# Patient Record
Sex: Male | Born: 1970 | Race: White | Hispanic: No | Marital: Married | Smoking: Former smoker
Health system: Southern US, Community
[De-identification: ages and names within clinical notes are randomized; demographics above are authoritative.]

## PROBLEM LIST (undated history)

## (undated) DIAGNOSIS — I10 Essential (primary) hypertension: Secondary | ICD-10-CM

## (undated) DIAGNOSIS — M545 Low back pain, unspecified: Secondary | ICD-10-CM

---

## 2019-09-30 ENCOUNTER — Other Ambulatory Visit: Payer: Self-pay | Admitting: Physician Assistant

## 2019-09-30 DIAGNOSIS — E291 Testicular hypofunction: Secondary | ICD-10-CM

## 2020-10-21 ENCOUNTER — Emergency Department (HOSPITAL_COMMUNITY): Payer: Medicare Other

## 2020-10-21 ENCOUNTER — Emergency Department (HOSPITAL_COMMUNITY)
Admission: EM | Admit: 2020-10-21 | Discharge: 2020-10-21 | Discharge status: Against Medical Advice | Payer: Medicare Other | Attending: Emergency Medicine | Admitting: Emergency Medicine

## 2020-10-21 ENCOUNTER — Encounter (HOSPITAL_COMMUNITY): Payer: Self-pay

## 2020-10-21 DIAGNOSIS — S0990XA Unspecified injury of head, initial encounter: Secondary | ICD-10-CM | POA: Diagnosis present

## 2020-10-21 DIAGNOSIS — Y900 Blood alcohol level of less than 20 mg/100 ml: Secondary | ICD-10-CM | POA: Insufficient documentation

## 2020-10-21 DIAGNOSIS — Z87891 Personal history of nicotine dependence: Secondary | ICD-10-CM | POA: Diagnosis not present

## 2020-10-21 DIAGNOSIS — Z79899 Other long term (current) drug therapy: Secondary | ICD-10-CM | POA: Insufficient documentation

## 2020-10-21 DIAGNOSIS — S0081XA Abrasion of other part of head, initial encounter: Secondary | ICD-10-CM | POA: Insufficient documentation

## 2020-10-21 DIAGNOSIS — R531 Weakness: Secondary | ICD-10-CM | POA: Diagnosis not present

## 2020-10-21 DIAGNOSIS — W01198A Fall on same level from slipping, tripping and stumbling with subsequent striking against other object, initial encounter: Secondary | ICD-10-CM | POA: Insufficient documentation

## 2020-10-21 DIAGNOSIS — R001 Bradycardia, unspecified: Secondary | ICD-10-CM

## 2020-10-21 DIAGNOSIS — I959 Hypotension, unspecified: Secondary | ICD-10-CM

## 2020-10-21 DIAGNOSIS — Y92009 Unspecified place in unspecified non-institutional (private) residence as the place of occurrence of the external cause: Secondary | ICD-10-CM | POA: Insufficient documentation

## 2020-10-21 DIAGNOSIS — I10 Essential (primary) hypertension: Secondary | ICD-10-CM | POA: Diagnosis not present

## 2020-10-21 HISTORY — DX: Low back pain, unspecified: M54.50

## 2020-10-21 HISTORY — DX: Essential (primary) hypertension: I10

## 2020-10-21 LAB — BASIC METABOLIC PANEL
Anion gap: 7 (ref 5–15)
BUN: 16 mg/dL (ref 6–20)
CO2: 22 mmol/L (ref 22–32)
Calcium: 9.7 mg/dL (ref 8.9–10.3)
Chloride: 109 mmol/L (ref 98–111)
Creatinine, Ser: 1.69 mg/dL — ABNORMAL HIGH (ref 0.61–1.24)
GFR, Estimated: 49 mL/min — ABNORMAL LOW (ref >60)
Glucose, Bld: 119 mg/dL — ABNORMAL HIGH (ref 70–99)
Potassium: 4.1 mmol/L (ref 3.5–5.1)
Sodium: 138 mmol/L (ref 135–145)

## 2020-10-21 LAB — CBC
HCT: 39.7 % (ref 39.0–52.0)
Hemoglobin: 13 g/dL (ref 13.0–17.0)
MCH: 30.1 pg (ref 26.0–34.0)
MCHC: 32.7 g/dL (ref 30.0–36.0)
MCV: 91.9 fL (ref 80.0–100.0)
Platelets: 231 10*3/uL (ref 150–400)
RBC: 4.32 MIL/uL (ref 4.22–5.81)
RDW: 13.9 % (ref 11.5–15.5)
WBC: 7.2 10*3/uL (ref 4.0–10.5)
nRBC: 0 % (ref 0.0–0.2)

## 2020-10-21 LAB — ETHANOL: Alcohol, Ethyl (B): <10 mg/dL (ref <10)

## 2020-10-21 LAB — TROPONIN I (HIGH SENSITIVITY)
Troponin I (High Sensitivity): 3 ng/L (ref <18)
Troponin I (High Sensitivity): 4 ng/L (ref <18)

## 2020-10-21 LAB — LACTIC ACID, PLASMA: Lactic Acid, Venous: 1.7 mmol/L (ref 0.5–1.9)

## 2020-10-21 MED ORDER — SODIUM CHLORIDE 0.9 % IV BOLUS
1000.0000 mL | Freq: Once | INTRAVENOUS | Status: AC
  Administered 2020-10-21: 1000 mL via INTRAVENOUS

## 2020-10-21 NOTE — ED Notes (Signed)
Notified Dr Anitra Lauth of pt's bp and obtained verbal orders for a second bolus of fluids.

## 2020-10-21 NOTE — ED Notes (Signed)
Patient transported to CT 

## 2020-10-21 NOTE — ED Provider Notes (Signed)
Emergency Medicine Provider Triage Evaluation Note  Jon Long , a 50 y.o. male  was evaluated in triage.  Pt complains of his legs giving out.  He states that his legs have intermittently been giving out on him for "years."  He denies any fevers.  He states he has not been sick stating "I never get sick."  He denies accidentally taking too much of his medications.  Today he was getting out of the car when he fell.  He struck his left-sided head and scraped his left knee.  He denies any other injuries.  Review of Systems  Positive: Weakness, legs giving out Negative: Fevers, N/V/D.  Physical Exam  BP (!) 80/54 (BP Location: Right Arm)   Pulse 97   Temp 98.1 F (36.7 C) (Oral)   Resp 16   SpO2 95%  Gen:   Awake, no distress   Resp:  Normal effort  MSK:   Moves extremities without difficulty  Other:  Abrasion on left sided head, left knee.   Medical Decision Making  Medically screening exam initiated at 2:04 PM.  Appropriate orders placed.  Ade Stmarie was informed that the remainder of the evaluation will be completed by another provider, this initial triage assessment does not replace that evaluation, and the importance of remaining in the ED until their evaluation is complete.  Patient is hypotensive, will need to go directly to room from traige.     Cristina Gong, Cordelia Poche 10/21/20 2131    Rolan Bucco, MD 10/24/20 1504

## 2020-10-21 NOTE — ED Triage Notes (Signed)
Pt  here from home with c/o gen weakness, states that his legs give out , hypotensive in triage , pt fell and hit his head today , no blood thinners

## 2020-10-21 NOTE — ED Notes (Signed)
Pt has superficial abrasions on left side of anterior of head and a small superficial abraision of left knee. Bleeding is controlled of both.

## 2020-10-21 NOTE — ED Notes (Signed)
Cali Cuartas wife [phone]

## 2020-10-21 NOTE — ED Notes (Addendum)
Dr Wilkie Aye at bedside. Informed MD re: HR= 39 to 40s sustaining, BP now 108/64. Pt at bedside denies complaints. States "I feel great".

## 2020-10-21 NOTE — ED Notes (Signed)
Notified Dr Wilkie Aye of pt's bp despite giving 2L of NS and she said she will come see pt.

## 2020-10-21 NOTE — ED Provider Notes (Signed)
[facility] EMERGENCY DEPARTMENT Provider Note   CSN: [id number] Arrival date & time: 10/21/20  1346     History No chief complaint on file.   Shi Blankenship is a 50 y.o. male.  HPI  50 year old male with past medical history of HTN presents the emergency department with weakness and a fall with head injury.  Patient states over the past couple years he has episodes where he is weak and his "legs give out".  This happened when he got out of the car today, he states his legs gave out and he fell forward hitting the left side of his head.  No loss of consciousness.  No syncope.  Patient states has been compliant with his medications and otherwise in his baseline health.  He admits that he has had trouble with his blood pressure being too low in the past but he has not been following up and there is no active work-up for this.  He denies any chest pain, shortness of breath, lightheadedness.  Patient states that he feels back to baseline and denies any current complaints.  Currently there is no family at bedside.  He does not know what medications he is on.  He is a very minimal historian.  Past Medical History:  Diagnosis Date   Hypertension    Low back pain     There are no problems to display for this patient.   History reviewed. No pertinent surgical history.     No family history on file.  Social History   Tobacco Use   Smoking status: Former    Years: 30.00    Pack years: 0.00    Types: Cigarettes   Smokeless tobacco: Never  Substance Use Topics   Alcohol use: Never   Drug use: Never    Home Medications Prior to Admission medications   Medication Sig Start Date End Date Taking? Authorizing Provider  acetaminophen (TYLENOL) 500 MG tablet Take 2,000 mg by mouth 2 (two) times daily as needed for headache.   Yes [provider]  Calcium Carb-Cholecalciferol (CALCIUM 600 + D PO) Take 1 tablet by mouth at bedtime.   Yes [provider]   Cholecalciferol (VITAMIN D3) 250 MCG (10000 UT) capsule Take 20,000 Units by mouth every morning.   Yes [provider]  clonazePAM (KLONOPIN) 0.5 MG tablet Take 0.5 mg by mouth 2 (two) times daily. scheduled 10/14/20  Yes [provider]  famotidine (PEPCID) 40 MG tablet Take 40 mg by mouth daily with supper. 07/17/20  Yes [provider]  HYDROmorphone (DILAUDID) 4 MG tablet Take 4 mg by mouth 4 (four) times daily. scheduled 10/14/20  Yes [provider]  lisinopril (ZESTRIL) 10 MG tablet Take 10 mg by mouth in the morning. 06/22/20  Yes [provider]  methocarbamol (ROBAXIN) 750 MG tablet Take 1,500 mg by mouth 3 (three) times daily. scheduled 10/07/20  Yes [provider]  omeprazole (PRILOSEC) 20 MG capsule Take 20 mg by mouth in the morning. 07/17/20  Yes [provider]  ondansetron (ZOFRAN) 8 MG tablet Take 8 mg by mouth 2 (two) times daily as needed for nausea or vomiting.   Yes [provider]  pregabalin (LYRICA) 50 MG capsule Take 50 mg by mouth 2 (two) times daily. 10/14/20  Yes [provider]  risperiDONE (RISPERDAL) 1 MG tablet Take 1 mg by mouth in the morning. 07/17/20  Yes [provider]  topiramate (TOPAMAX) 100 MG tablet Take 100 mg by  mouth 2 (two) times daily. 10/14/20  Yes [provider]    Allergies    Venlafaxine  Review of Systems   Review of Systems  Constitutional:  Positive for fatigue. Negative for chills and fever.  HENT:  Negative for congestion.   Eyes:  Negative for visual disturbance.  Respiratory:  Negative for chest tightness and shortness of breath.   Cardiovascular:  Negative for chest pain, palpitations and leg swelling.  Gastrointestinal:  Negative for abdominal pain, diarrhea and vomiting.  Genitourinary:  Negative for dysuria.  Musculoskeletal:  Negative for neck pain.  Skin:  Positive for wound. Negative for rash.  Neurological:  Positive for weakness.  Negative for seizures, syncope and headaches.   Physical Exam Updated Vital Signs BP (!) 92/59   Pulse (!) 47   Temp 98.1 F (36.7 C) (Oral)   Resp 19   Ht 5\' 10"  (1.778 m)   Wt 80.3 kg   SpO2 99%   BMI 25.40 kg/m   Physical Exam Vitals and nursing note reviewed.  Constitutional:      Appearance: Normal appearance.     Comments: Flat affect, minimal historian  HENT:     Head: Normocephalic.     Comments: Abrasions to forehead    Mouth/Throat:     Mouth: Mucous membranes are moist.  Eyes:     Pupils: Pupils are equal, round, and reactive to light.  Cardiovascular:     Rate and Rhythm: Normal rate.  Pulmonary:     Effort: Pulmonary effort is normal. No respiratory distress.  Abdominal:     Palpations: Abdomen is soft.     Tenderness: There is no abdominal tenderness.  Musculoskeletal:        General: No deformity.     Cervical back: No tenderness.  Skin:    General: Skin is warm.  Neurological:     Mental Status: He is alert and oriented to person, place, and time. Mental status is at baseline.  Psychiatric:        Mood and Affect: Mood normal.    ED Results / Procedures / Treatments   Labs (all labs ordered are listed, but only abnormal results are displayed) Labs Reviewed  BASIC METABOLIC PANEL - Abnormal; Notable for the following components:      Result Value   Glucose, Bld 119 (*)    Creatinine, Ser 1.69 (*)    GFR, Estimated 49 (*)    All other components within normal limits  CBC  ETHANOL  LACTIC ACID, PLASMA  URINALYSIS, ROUTINE W REFLEX MICROSCOPIC  RAPID URINE DRUG SCREEN, HOSP PERFORMED  TROPONIN I (HIGH SENSITIVITY)  TROPONIN I (HIGH SENSITIVITY)    EKG EKG Interpretation  Date/Time:  Friday October 21 2020 13:53:55 EDT Ventricular Rate:  102 PR Interval:  164 QRS Duration: 80 QT Interval:  324 QTC Calculation: 422 R Axis:   36 Text Interpretation: Sinus tachycardia Otherwise normal ECG Confirmed by 01-22-2000 [phone]) on 10/21/2020  3:17:08 PM  Radiology DG Chest Port 1 View  Result Date: 10/21/2020 CLINICAL DATA:  Syncopal episodes. EXAM: PORTABLE CHEST 1 VIEW COMPARISON:  None. FINDINGS: The heart size and mediastinal contours are within normal limits. Both lungs are clear. The visualized skeletal structures are unremarkable. IMPRESSION: No active disease. Electronically Signed   By: 10/23/2020 M.D.   On: 10/21/2020 15:51    Procedures Procedures   Medications Ordered in ED Medications  sodium chloride 0.9 % bolus 1,000 mL (0 mLs Intravenous Stopped 10/21/20 1542)  sodium chloride 0.9 % bolus 1,000 mL (0 mLs Intravenous Stopped 10/21/20 1542)    ED Course  I have reviewed the triage vital signs and the nursing notes.  Pertinent labs & imaging results that were available during my care of the patient were reviewed by me and considered in my medical decision making (see chart for details).    MDM Rules/Calculators/A&P                          50 year old male who presents emergency department after a episode of weakness, his legs giving out with head injury.  He is a minimal historian, requesting to leave the hospital every time I speak with him.  On arrival he is hypotensive with systolics in the 70s.  After 2 L of normal saline he continues to be hypotensive, normal heart rate, no family at bedside.  He is adamantly denying any symptoms including chest pain, syncope.  He states that he feels back to baseline and wants to leave.  I was able to convince him to stay for his work-up and until his wife gets here.  Blood work is reassuring, creatinine is slightly elevated at 1.69 but no other acute abnormalities.  No signs of sepsis, alcohol is negative.  CT imaging was negative, chest x-ray is clear.  Troponin is negative.  During his evaluation the patient became bradycardic with heart rates dropping into the low 40s, at this time his blood pressure started to respond to his third liter of normal saline and  became normotensive.  Patient's heart rate was very labile ranging anywhere from low 40s to 60s.  He continued to state that he is asymptomatic.  The wife arrived and she states that he has been having these weakness episodes very frequently.  Sometimes he becomes "unresponsive" and does have syncope.  Other times he just kind of stares off in space, no admitted seizure-like activity.  My concern would be for this bradycardia and hypotension causing the symptoms, syncope and possible hypoperfusion.  I have requested for further work-up and to admit the patient however he denies.  He states that hospitals make him nervous and he refuses to stay.  Patient understands my concern in regards to his ranging heart rate and low blood pressure.  He understands this could mean a primary heart problem, arrhythmia, could result in permanent disability and/or death.  The wife is at bedside during this conversation.  Patient has capacity and is alert and oriented.  I was unable to convince the patient to stay for further evaluation.  Patient and wife left before I was able to give them discharge paperwork.  I will place an ambulatory referral to cardiology in hopes of outpatient evaluation of this bradycardia and hypotension that is most likely contributing to these weakness episodes.    I have recommended that the patient stays in the department for the completion of their workup however they decline.  I have expressed the importance of staying including the risks of leaving which include worsening condition, permanent disability and death.  Patient accepts these risks.  They are alert and oriented, they have capacity to make this decision.  I have not been able to convince the patient to stay and they understand the risks of leaving AGAINST MEDICAL ADVICE.    Final Clinical Impression(s) / ED Diagnoses Final diagnoses:  None    Rx / DC Orders ED Discharge Orders     None  Rozelle Logan,  DO 10/21/20 1849

## 2020-10-21 NOTE — ED Notes (Signed)
Notified Dr Wilkie Aye that pt is now bradycardic in the 40's, she said she will come see pt.

## 2020-10-25 NOTE — Progress Notes (Signed)
TOC CSW accepted a call from pts wife @ 11:03am in regards to pts Cardiologist appointment.  CSW informed pt to seek a referral from his PCP, due to him leaving AMA.  CSW also informed pts wife that pt would have received that referral from his AVS at the time of discharge but by pt leaving AMA he did not receive his AVS.  CSW did state the following that was on AVS, but instructed pts wife to obtain a referral from PCP.  Ambulatory referral to Cardiology  Where:[facility] Address:[address] [place] [address] Phone:[phone] Expires:10/21/2021 (requested) Bradycardia, hypotension, episodes of "weakness", left AMA   Naara Kelty Tarpley-Carter, MSW, LCSW-A Pronouns:  She, Her, Hers                  [facility] ED Transitions of CareClinical Social Worker [email or web address] [phone]

## 2021-01-11 ENCOUNTER — Ambulatory Visit: Payer: Medicare Other | Admitting: Podiatry

## 2021-02-07 ENCOUNTER — Ambulatory Visit: Payer: Medicare Other | Admitting: Podiatry

## 2022-04-17 IMAGING — CT CT CERVICAL SPINE W/O CM
3 of 4 series · 13 of 33 positions shown, 16 images · non-contrast
Comparison: No pertinent prior exams available for comparison.

CLINICAL DATA: Head trauma, moderate/severe. Additional provided:
Fall, laceration to forehead, high blood pressure.

EXAM:
CT HEAD WITHOUT CONTRAST
CT CERVICAL SPINE WITHOUT CONTRAST
TECHNIQUE: Multidetector CT imaging of the head and cervical spine was
performed following the standard protocol without intravenous
contrast. Multiplanar CT image reconstructions of the cervical spine
were also generated.

[Series 8: sag bone · sagittal · 0.33mm/px · 5 of 92 slices shown, 6 images]
[im 31/92  bone]
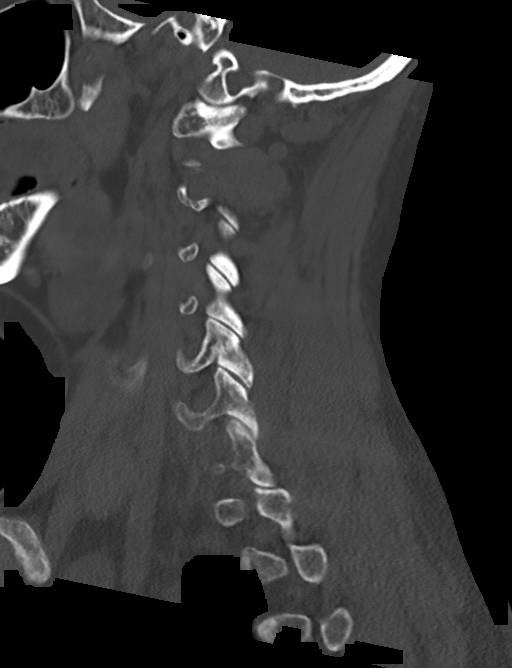
[im 38/92  bone]
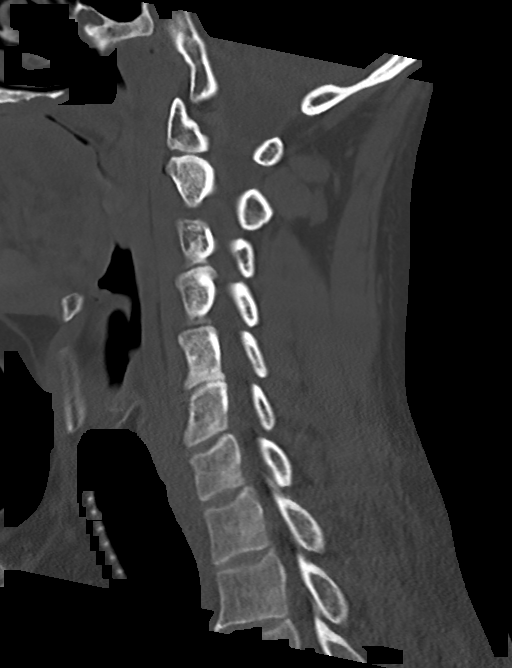
[im 46/92  soft-tissue]
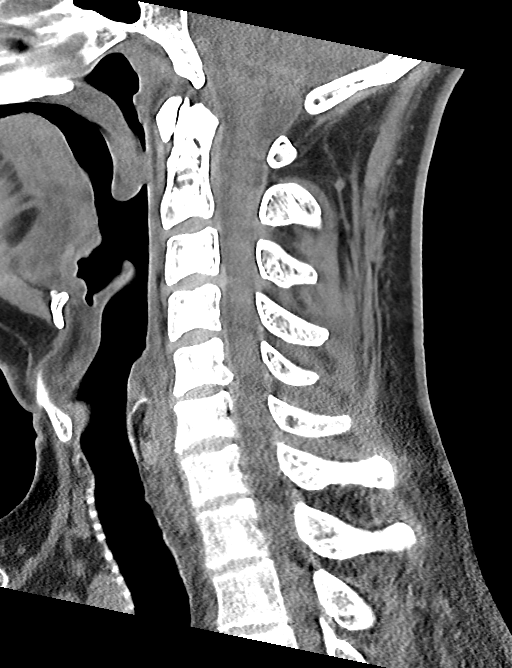
[im 46/92  bone]
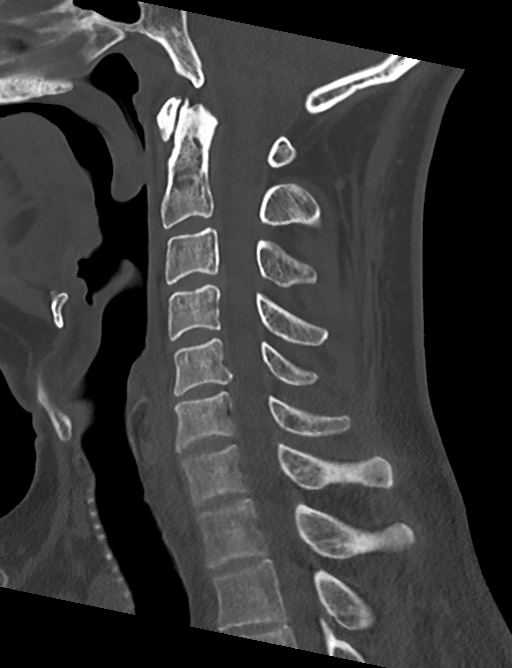
[im 54/92  bone]
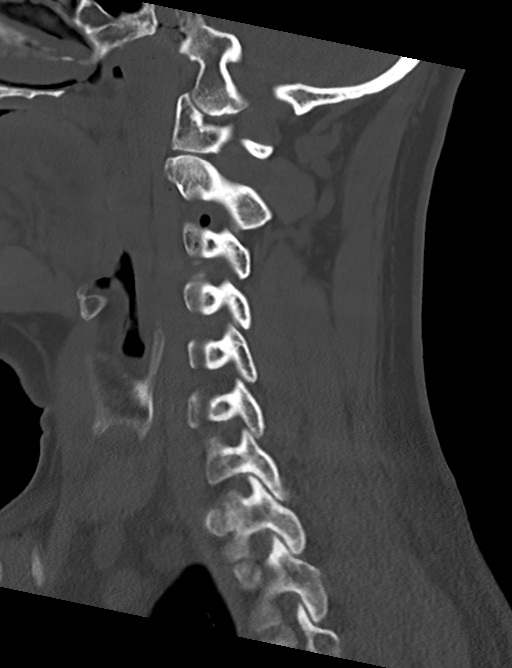
[im 61/92  bone]
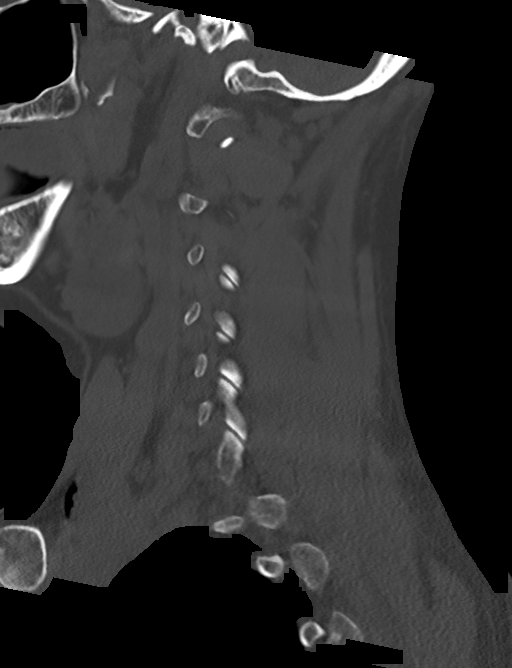

[Series 9: cor bone · coronal · 0.40mm/px · 3 of 70 slices shown]
[im 14/70  bone]
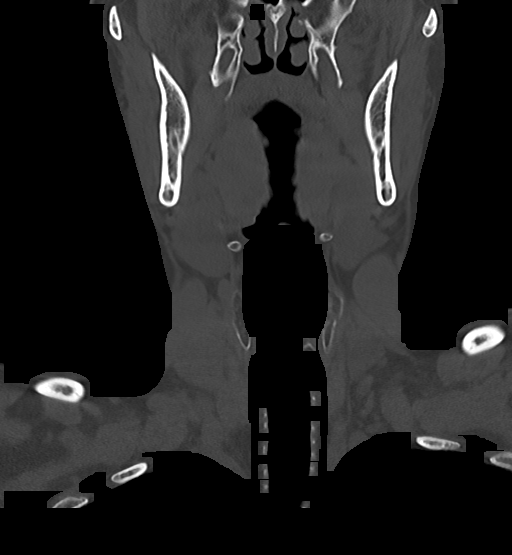
[im 28/70  bone]
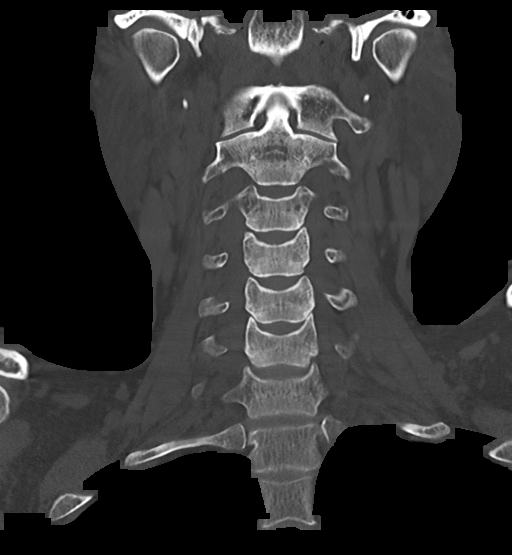
[im 42/70  bone]
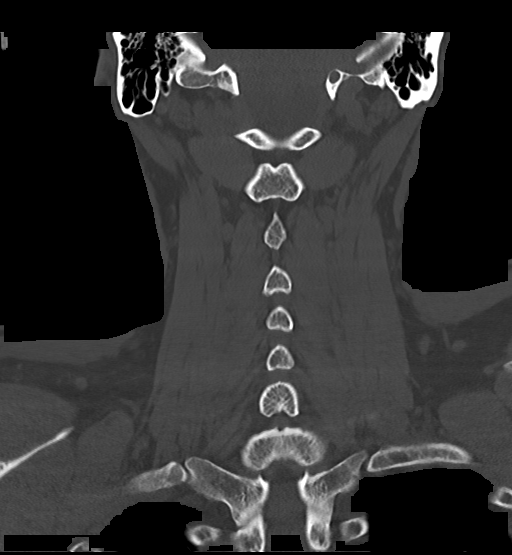

[Series 10: orthogonal axials · axial · 0.21mm/px · z∈[-263,-142]mm · 5 of 97 slices shown, 7 images]
[im 17/97  soft-tissue]
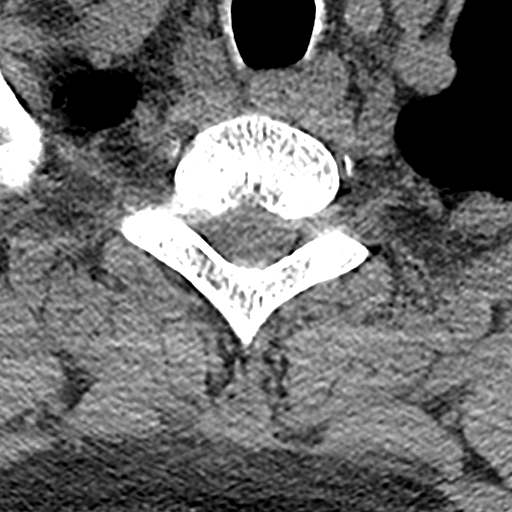
[im 17/97  bone]
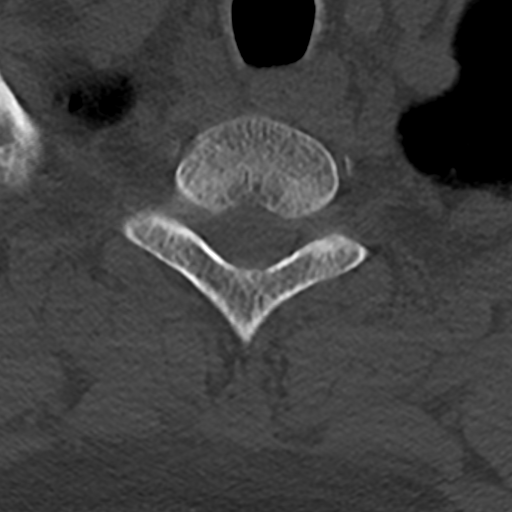
[im 33/97  bone]
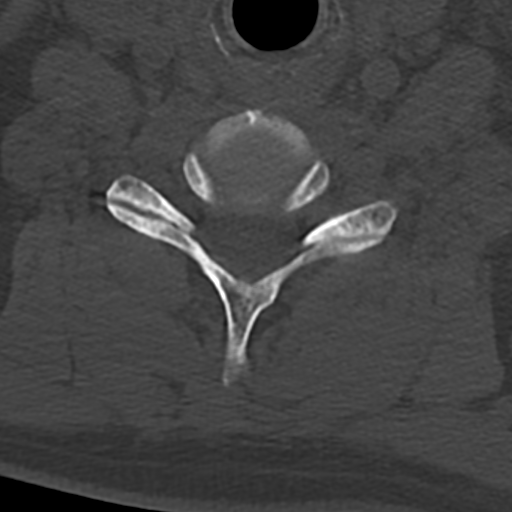
[im 49/97  bone]
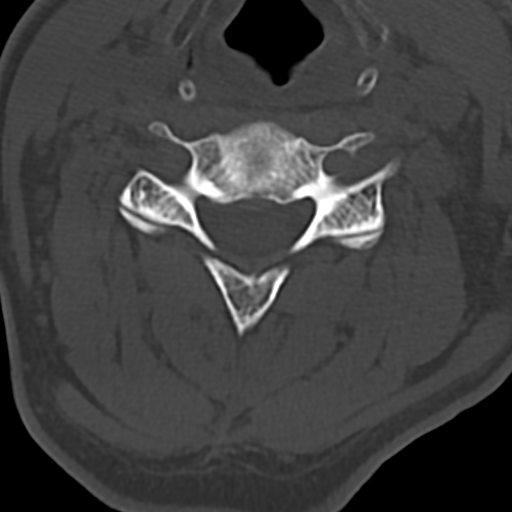
[im 65/97  bone]
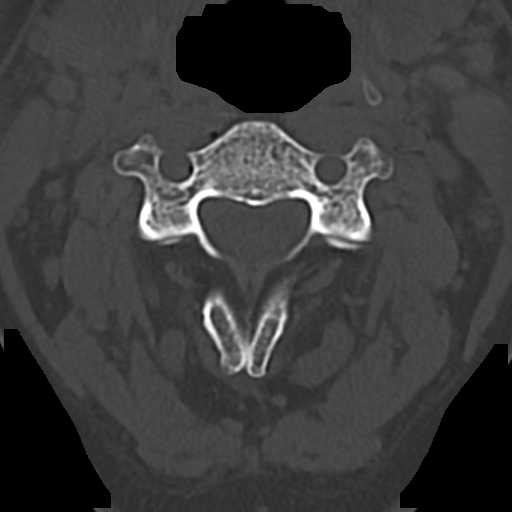
[im 81/97  soft-tissue]
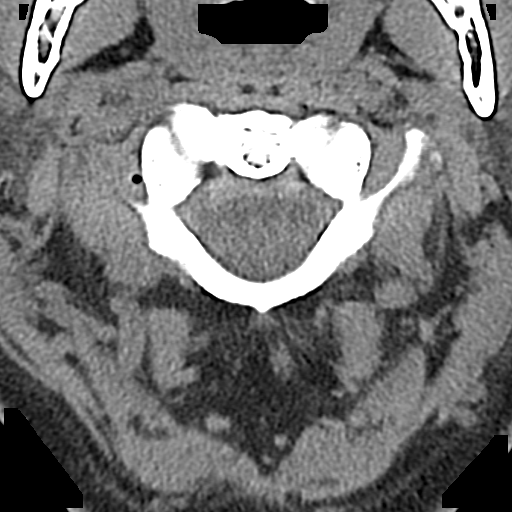
[im 81/97  bone]
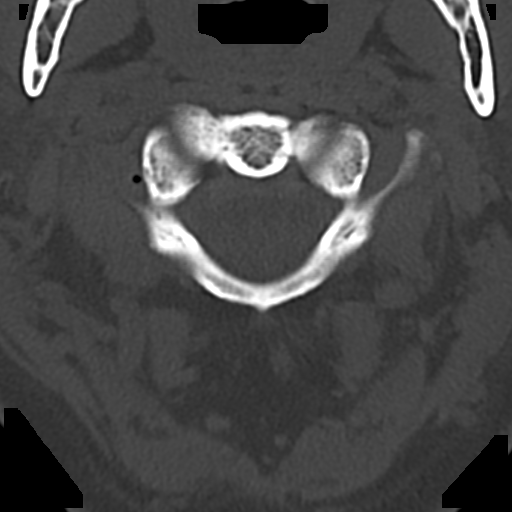

[13 of 33 positions shown; findings below may reference images not displayed]

FINDINGS: CT HEAD FINDINGS

Brain:

Cerebral volume is normal for age.

There is no acute intracranial hemorrhage.

No demarcated cortical infarct.

No extra-axial fluid collection.

No evidence of intracranial mass.

No midline shift.

Vascular: No hyperdense vessel.  Atherosclerotic calcifications.

Skull: Normal. Negative for fracture or focal lesion.

Sinuses/Orbits: Visualized orbits show no acute finding. Mild
bilateral ethmoid sinus mucosal thickening.

Other: Scattered venous gas, likely from recent IV placement.

CT CERVICAL SPINE FINDINGS

Alignment: Straightening of the expected cervical lordosis. No
significant spondylolisthesis

Skull base and vertebrae: The basion-dental and atlanto-dental
intervals are maintained.No evidence of acute fracture to the
cervical spine.

Soft tissues and spinal canal: No prevertebral fluid or swelling. No
visible canal hematoma.

Disc levels: Cervical spondylosis. Most notably at C5-C6, there is
mild-to-moderate disc space narrowing with a central disc
protrusion, endplate spurring and uncovertebral hypertrophy.
Apparent mild/moderate spinal canal stenosis at this level.
Uncovertebral hypertrophy results in bilateral bony neural foraminal
narrowing at C3-C4.

Upper chest: No consolidation within the imaged lung apices. No
visible pneumothorax.
IMPRESSION: CT head:

1. No evidence of acute intracranial abnormality.
2. Mild bilateral ethmoid sinus mucosal thickening.

CT cervical spine:

1. No evidence of acute fracture to the cervical spine.
2. Nonspecific straightening of the expected cervical lordosis.
3. Cervical spondylosis, as described.

## 2022-07-14 DIAGNOSIS — M542 Cervicalgia: Secondary | ICD-10-CM | POA: Diagnosis not present

## 2022-07-14 DIAGNOSIS — Z5900 Homelessness unspecified: Secondary | ICD-10-CM | POA: Diagnosis not present

## 2022-07-14 DIAGNOSIS — R001 Bradycardia, unspecified: Secondary | ICD-10-CM | POA: Diagnosis not present

## 2022-07-14 DIAGNOSIS — W1839XA Other fall on same level, initial encounter: Secondary | ICD-10-CM | POA: Diagnosis not present

## 2022-07-14 DIAGNOSIS — R7989 Other specified abnormal findings of blood chemistry: Secondary | ICD-10-CM | POA: Diagnosis not present

## 2022-07-14 DIAGNOSIS — R55 Syncope and collapse: Secondary | ICD-10-CM | POA: Diagnosis not present

## 2022-07-14 DIAGNOSIS — M25571 Pain in right ankle and joints of right foot: Secondary | ICD-10-CM | POA: Diagnosis not present

## 2022-07-14 DIAGNOSIS — Z87891 Personal history of nicotine dependence: Secondary | ICD-10-CM | POA: Diagnosis not present

## 2022-07-14 DIAGNOSIS — S199XXA Unspecified injury of neck, initial encounter: Secondary | ICD-10-CM | POA: Diagnosis not present

## 2022-07-14 DIAGNOSIS — Z79899 Other long term (current) drug therapy: Secondary | ICD-10-CM | POA: Diagnosis not present

## 2022-07-14 DIAGNOSIS — Y9301 Activity, walking, marching and hiking: Secondary | ICD-10-CM | POA: Diagnosis not present

## 2022-07-14 DIAGNOSIS — I10 Essential (primary) hypertension: Secondary | ICD-10-CM | POA: Diagnosis not present

## 2022-07-14 DIAGNOSIS — X58XXXA Exposure to other specified factors, initial encounter: Secondary | ICD-10-CM | POA: Diagnosis not present

## 2022-07-14 DIAGNOSIS — S0990XA Unspecified injury of head, initial encounter: Secondary | ICD-10-CM | POA: Diagnosis not present

## 2022-07-14 DIAGNOSIS — S99911A Unspecified injury of right ankle, initial encounter: Secondary | ICD-10-CM | POA: Diagnosis not present

## 2022-07-15 ENCOUNTER — Other Ambulatory Visit: Payer: Self-pay

## 2022-07-15 ENCOUNTER — Emergency Department (HOSPITAL_COMMUNITY)
Admission: EM | Admit: 2022-07-15 | Discharge: 2022-07-16 | Disposition: A | Discharge status: Home / Self Care | Payer: Medicare HMO | Attending: Emergency Medicine | Admitting: Emergency Medicine

## 2022-07-15 ENCOUNTER — Emergency Department (HOSPITAL_COMMUNITY): Payer: Medicare HMO

## 2022-07-15 DIAGNOSIS — I77819 Aortic ectasia, unspecified site: Secondary | ICD-10-CM | POA: Diagnosis not present

## 2022-07-15 DIAGNOSIS — R0789 Other chest pain: Secondary | ICD-10-CM | POA: Insufficient documentation

## 2022-07-15 DIAGNOSIS — R55 Syncope and collapse: Secondary | ICD-10-CM | POA: Diagnosis not present

## 2022-07-15 DIAGNOSIS — R079 Chest pain, unspecified: Secondary | ICD-10-CM | POA: Diagnosis not present

## 2022-07-15 DIAGNOSIS — R001 Bradycardia, unspecified: Secondary | ICD-10-CM | POA: Diagnosis not present

## 2022-07-15 DIAGNOSIS — I517 Cardiomegaly: Secondary | ICD-10-CM | POA: Diagnosis not present

## 2022-07-15 LAB — BASIC METABOLIC PANEL
Anion gap: 9 (ref 5–15)
BUN: 19 mg/dL (ref 6–20)
CO2: 24 mmol/L (ref 22–32)
Calcium: 9.2 mg/dL (ref 8.9–10.3)
Chloride: 105 mmol/L (ref 98–111)
Creatinine, Ser: 1.26 mg/dL — ABNORMAL HIGH (ref 0.61–1.24)
GFR, Estimated: >60 mL/min (ref >60)
Glucose, Bld: 97 mg/dL (ref 70–99)
Potassium: 4.3 mmol/L (ref 3.5–5.1)
Sodium: 138 mmol/L (ref 135–145)

## 2022-07-15 LAB — CBC
HCT: 46.1 % (ref 39.0–52.0)
Hemoglobin: 15.9 g/dL (ref 13.0–17.0)
MCH: 31.5 pg (ref 26.0–34.0)
MCHC: 34.5 g/dL (ref 30.0–36.0)
MCV: 91.5 fL (ref 80.0–100.0)
Platelets: 249 10*3/uL (ref 150–400)
RBC: 5.04 MIL/uL (ref 4.22–5.81)
RDW: 13.7 % (ref 11.5–15.5)
WBC: 9.2 10*3/uL (ref 4.0–10.5)
nRBC: 0 % (ref 0.0–0.2)

## 2022-07-15 LAB — TROPONIN I (HIGH SENSITIVITY): Troponin I (High Sensitivity): 3 ng/L (ref <18)

## 2022-07-15 NOTE — ED Provider Notes (Signed)
[facility] Provider Note   CSN: [id number] Arrival date & time: 07/15/22  2214     History {Add pertinent medical, surgical, social history, OB history to HPI:1} Chief Complaint  Patient presents with  . Chest Pain    Jon Long is a 52 y.o. male.  Patient presents to the emergency department for evaluation of chest pain.  He reports that he has been experiencing intermittent episodes of chest pain throughout the day today.  Episodes last for a couple of hours and then may go away.  Pain is sharp in nature, goes through to the back.  He reports increased pain with touching the chest, bending and twisting the torso.  Denies injury.  No shortness of breath.      Home Medications Prior to Admission medications   Medication Sig Start Date End Date Taking? Authorizing Provider  acetaminophen (TYLENOL) 500 MG tablet Take 2,000 mg by mouth 2 (two) times daily as needed for headache.    [provider]  Calcium Carb-Cholecalciferol (CALCIUM 600 + D PO) Take 1 tablet by mouth at bedtime.    [provider]  Cholecalciferol (VITAMIN D3) 250 MCG (10000 UT) capsule Take 20,000 Units by mouth every morning.    [provider]  clonazePAM (KLONOPIN) 0.5 MG tablet Take 0.5 mg by mouth 2 (two) times daily. scheduled 10/14/20   [provider]  famotidine (PEPCID) 40 MG tablet Take 40 mg by mouth daily with supper. 07/17/20   [provider]  HYDROmorphone (DILAUDID) 4 MG tablet Take 4 mg by mouth 4 (four) times daily. scheduled 10/14/20   [provider]  lisinopril (ZESTRIL) 10 MG tablet Take 10 mg by mouth in the morning. 06/22/20   [provider]  methocarbamol (ROBAXIN) 750 MG tablet Take 1,500 mg by mouth 3 (three) times daily. scheduled 10/07/20   [provider]  omeprazole (PRILOSEC) 20 MG capsule Take 20 mg by mouth in the morning. 07/17/20   [provider]   ondansetron (ZOFRAN) 8 MG tablet Take 8 mg by mouth 2 (two) times daily as needed for nausea or vomiting.    [provider]  pregabalin (LYRICA) 50 MG capsule Take 50 mg by mouth 2 (two) times daily. 10/14/20   [provider]  risperiDONE (RISPERDAL) 1 MG tablet Take 1 mg by mouth in the morning. 07/17/20   [provider]  topiramate (TOPAMAX) 100 MG tablet Take 100 mg by mouth 2 (two) times daily. 10/14/20   [provider]      Allergies    Venlafaxine    Review of Systems   Review of Systems  Physical Exam Updated Vital Signs BP 126/88   Pulse 68   Temp 98 F (36.7 C) (Oral)   Resp 10   SpO2 99%  Physical Exam Vitals and nursing note reviewed.  Constitutional:      General: He is not in acute distress.    Appearance: He is well-developed.  HENT:     Head: Normocephalic and atraumatic.     Mouth/Throat:     Mouth: Mucous membranes are moist.  Eyes:     General: Vision grossly intact. Gaze aligned appropriately.     Extraocular Movements: Extraocular movements intact.     Conjunctiva/sclera: Conjunctivae normal.  Cardiovascular:     Rate and Rhythm: Normal rate and regular rhythm.     Pulses: Normal pulses.     Heart sounds: Normal heart sounds, S1 normal  and S2 normal. No murmur heard.    No friction rub. No gallop.  Pulmonary:     Effort: Pulmonary effort is normal. No respiratory distress.     Breath sounds: Normal breath sounds.  Chest:     Chest wall: Tenderness present.    Abdominal:     Palpations: Abdomen is soft.     Tenderness: There is no abdominal tenderness. There is no guarding or rebound.     Hernia: No hernia is present.  Musculoskeletal:        General: No swelling.     Cervical back: Full passive range of motion without pain, normal range of motion and neck supple. No pain with movement, spinous process tenderness or muscular tenderness. Normal range of motion.     Thoracic back: Tenderness present. Decreased  range of motion.       Back:     Right lower leg: No edema.     Left lower leg: No edema.  Skin:    General: Skin is warm and dry.     Capillary Refill: Capillary refill takes less than 2 seconds.     Findings: No ecchymosis, erythema, lesion or wound.  Neurological:     Mental Status: He is alert and oriented to person, place, and time.     GCS: GCS eye subscore is 4. GCS verbal subscore is 5. GCS motor subscore is 6.     Cranial Nerves: Cranial nerves 2-12 are intact.     Sensory: Sensation is intact.     Motor: Motor function is intact. No weakness or abnormal muscle tone.     Coordination: Coordination is intact.  Psychiatric:        Mood and Affect: Mood normal.        Speech: Speech normal.        Behavior: Behavior normal.    ED Results / Procedures / Treatments   Labs (all labs ordered are listed, but only abnormal results are displayed) Labs Reviewed  BASIC METABOLIC PANEL - Abnormal; Notable for the following components:      Result Value   Creatinine, Ser 1.26 (*)    All other components within normal limits  CBC  TROPONIN I (HIGH SENSITIVITY)    EKG EKG Interpretation  Date/Time:  Sunday July 15 2022 22:56:55 EDT Ventricular Rate:  58 PR Interval:  195 QRS Duration: 88 QT Interval:  411 QTC Calculation: 404 R Axis:   60 Text Interpretation: Sinus rhythm Left atrial enlargement ST elev, probable normal early repol pattern No significant change since last tracing Confirmed by Orpah Greek [phone]) on 07/15/2022 11:10:36 PM  Radiology DG Chest 2 View  Result Date: 07/15/2022 CLINICAL DATA:  Chest pain EXAM: CHEST - 2 VIEW COMPARISON:  10/21/2020 FINDINGS: The heart size and mediastinal contours are within normal limits. Both lungs are clear. The visualized skeletal structures are unremarkable. IMPRESSION: No active cardiopulmonary disease. Electronically Signed   By: Placido Sou M.D.   On: 07/15/2022 22:44    Procedures Procedures  {Document  cardiac monitor, telemetry assessment procedure when appropriate:1}  Medications Ordered in ED Medications - No data to display  ED Course/ Medical Decision Making/ A&P   {   Click here for ABCD2, HEART and other calculatorsREFRESH Note before signing :1}                          Medical Decision Making Amount and/or Complexity of Data Reviewed Labs: ordered. Radiology:  ordered.   ***  {Document critical care time when appropriate:1} {Document review of labs and clinical decision tools ie heart score, Chads2Vasc2 etc:1}  {Document your independent review of radiology images, and any outside records:1} {Document your discussion with family members, caretakers, and with consultants:1} {Document social determinants of health affecting pt's care:1} {Document your decision making why or why not admission, treatments were needed:1} Final Clinical Impression(s) / ED Diagnoses Final diagnoses:  None    Rx / DC Orders ED Discharge Orders     None

## 2022-07-15 NOTE — ED Triage Notes (Signed)
Patient reports central chest pain with SOB today , emesis x1 and syncopal episode today .

## 2022-07-16 DIAGNOSIS — R0789 Other chest pain: Secondary | ICD-10-CM | POA: Diagnosis not present

## 2022-07-16 LAB — TROPONIN I (HIGH SENSITIVITY): Troponin I (High Sensitivity): 4 ng/L (ref <18)

## 2022-07-16 MED ORDER — MIDAZOLAM HCL 2 MG/2ML IJ SOLN
1.0000 mg | Freq: Once | INTRAMUSCULAR | Status: DC
  Administered 2022-07-16: 1 mg via INTRAVENOUS
  Filled 2022-07-16: qty 2

## 2022-07-16 MED ORDER — IBUPROFEN 800 MG PO TABS: 800.0000 mg | ORAL_TABLET | Freq: Four times a day (QID) | ORAL | 0 refills | Status: AC | PRN

## 2022-07-16 MED ORDER — METHOCARBAMOL 500 MG PO TABS: 500.0000 mg | ORAL_TABLET | Freq: Three times a day (TID) | ORAL | 0 refills | Status: AC | PRN

## 2022-08-07 DIAGNOSIS — F331 Major depressive disorder, recurrent, moderate: Secondary | ICD-10-CM | POA: Diagnosis not present

## 2022-08-07 DIAGNOSIS — F17200 Nicotine dependence, unspecified, uncomplicated: Secondary | ICD-10-CM | POA: Diagnosis not present

## 2022-08-07 DIAGNOSIS — F419 Anxiety disorder, unspecified: Secondary | ICD-10-CM | POA: Diagnosis not present

## 2022-08-07 DIAGNOSIS — R69 Illness, unspecified: Secondary | ICD-10-CM | POA: Diagnosis not present

## 2022-08-07 DIAGNOSIS — R030 Elevated blood-pressure reading, without diagnosis of hypertension: Secondary | ICD-10-CM | POA: Diagnosis not present

## 2022-08-07 DIAGNOSIS — M545 Low back pain, unspecified: Secondary | ICD-10-CM | POA: Diagnosis not present

## 2022-08-07 DIAGNOSIS — Z79899 Other long term (current) drug therapy: Secondary | ICD-10-CM | POA: Diagnosis not present

## 2022-08-07 DIAGNOSIS — G2581 Restless legs syndrome: Secondary | ICD-10-CM | POA: Diagnosis not present

## 2022-08-08 DIAGNOSIS — M25471 Effusion, right ankle: Secondary | ICD-10-CM | POA: Diagnosis not present

## 2022-08-09 DIAGNOSIS — Z79899 Other long term (current) drug therapy: Secondary | ICD-10-CM | POA: Diagnosis not present

## 2022-09-04 DIAGNOSIS — M1711 Unilateral primary osteoarthritis, right knee: Secondary | ICD-10-CM | POA: Diagnosis not present

## 2022-09-05 ENCOUNTER — Other Ambulatory Visit: Payer: Self-pay

## 2022-09-05 ENCOUNTER — Encounter (HOSPITAL_COMMUNITY): Payer: Self-pay

## 2022-09-05 ENCOUNTER — Emergency Department (HOSPITAL_COMMUNITY): Payer: Medicare HMO

## 2022-09-05 ENCOUNTER — Emergency Department (HOSPITAL_COMMUNITY)
Admission: EM | Admit: 2022-09-05 | Discharge: 2022-09-05 | Disposition: A | Discharge status: Home / Self Care | Payer: Medicare HMO | Attending: Emergency Medicine | Admitting: Emergency Medicine

## 2022-09-05 DIAGNOSIS — S99922A Unspecified injury of left foot, initial encounter: Secondary | ICD-10-CM | POA: Diagnosis not present

## 2022-09-05 DIAGNOSIS — W010XXA Fall on same level from slipping, tripping and stumbling without subsequent striking against object, initial encounter: Secondary | ICD-10-CM | POA: Insufficient documentation

## 2022-09-05 DIAGNOSIS — I10 Essential (primary) hypertension: Secondary | ICD-10-CM | POA: Insufficient documentation

## 2022-09-05 DIAGNOSIS — M79675 Pain in left toe(s): Secondary | ICD-10-CM

## 2022-09-05 DIAGNOSIS — S91112A Laceration without foreign body of left great toe without damage to nail, initial encounter: Secondary | ICD-10-CM | POA: Diagnosis not present

## 2022-09-05 MED ORDER — HYDROCODONE-ACETAMINOPHEN 5-325 MG PO TABS: 1.0000 | ORAL_TABLET | Freq: Once | ORAL | Status: DC

## 2022-09-05 MED ORDER — KETOROLAC TROMETHAMINE 60 MG/2ML IM SOLN
30.0000 mg | Freq: Once | INTRAMUSCULAR | Status: AC
  Administered 2022-09-05: 30 mg via INTRAMUSCULAR
  Filled 2022-09-05: qty 2

## 2022-09-05 MED ORDER — CIPROFLOXACIN HCL 500 MG PO TABS
500.0000 mg | ORAL_TABLET | Freq: Once | ORAL | Status: AC
  Administered 2022-09-05: 500 mg via ORAL
  Filled 2022-09-05: qty 1

## 2022-09-05 MED ORDER — CIPROFLOXACIN HCL 500 MG PO TABS: 500.0000 mg | ORAL_TABLET | Freq: Two times a day (BID) | ORAL | 0 refills | Status: AC

## 2022-09-05 NOTE — ED Triage Notes (Signed)
Pt came in via POV d/t Lt great toe pain from falling "two to three weeks ago" hitting it. A/Ox4, rates pain 9/10 while in triage. There is a wound to the bottom of that toe that pt states will bleed sometimes, denies drainage. No bleeding while in triage.

## 2022-09-05 NOTE — ED Notes (Signed)
Pt is not answering, called multiple times

## 2022-09-05 NOTE — ED Provider Notes (Signed)
[place] EMERGENCY DEPARTMENT AT [facility] Provider Note   CSN: [id number] Arrival date & time: 09/05/22  1617     History  Chief Complaint  Patient presents with   Lt Great Toe injury    Jon Long is a 52 y.o. male.  With a history of hypertension who presents to the ED for evaluation of left great toe pain.  He states that he was walking approximately 3 days ago when his right knee gave out.  His left foot slid across the ground and hit door frame.  He states he suffered a laceration to the bottom of the great toe at that time.  He has had pain since that time.  He has been taking his home Dilaudid with improvement in his pain.  He states he was difficult to get the bleeding to stop initially, however he was able to do so with direct pressure.  He reports some intermittent bleeding since then but denies other drainage.  He denies fevers or chills, numbness, weakness or tingling.  His pain is mostly in the left great toe but does radiate up to the foot.  It is worse when ambulating.  No history of diabetes.  His wife has been placing alcohol and Neosporin on the wound daily.  HPI     Home Medications Prior to Admission medications   Medication Sig Start Date End Date Taking? Authorizing Provider  ciprofloxacin (CIPRO) 500 MG tablet Take 1 tablet (500 mg total) by mouth every 12 (twelve) hours for 7 days. 09/05/22 09/12/22 Yes Antionne Enrique, Edsel Petrin, PA-C  acetaminophen (TYLENOL) 500 MG tablet Take 2,000 mg by mouth 2 (two) times daily as needed for headache.    [provider]  Calcium Carb-Cholecalciferol (CALCIUM 600 + D PO) Take 1 tablet by mouth at bedtime.    [provider]  Cholecalciferol (VITAMIN D3) 250 MCG (10000 UT) capsule Take 20,000 Units by mouth every morning.    [provider]  clonazePAM (KLONOPIN) 0.5 MG tablet Take 0.5 mg by mouth 2 (two) times daily. scheduled 10/14/20   [provider]  famotidine (PEPCID) 40 MG tablet  Take 40 mg by mouth daily with supper. 07/17/20   [provider]  HYDROmorphone (DILAUDID) 4 MG tablet Take 4 mg by mouth 4 (four) times daily. scheduled 10/14/20   [provider]  ibuprofen (ADVIL) 800 MG tablet Take 1 tablet (800 mg total) by mouth every 6 (six) hours as needed for moderate pain. 07/16/22   Gilda Crease, MD  lisinopril (ZESTRIL) 10 MG tablet Take 10 mg by mouth in the morning. 06/22/20   [provider]  methocarbamol (ROBAXIN) 500 MG tablet Take 1 tablet (500 mg total) by mouth every 8 (eight) hours as needed for muscle spasms. 07/16/22   Gilda Crease, MD  omeprazole (PRILOSEC) 20 MG capsule Take 20 mg by mouth in the morning. 07/17/20   [provider]  ondansetron (ZOFRAN) 8 MG tablet Take 8 mg by mouth 2 (two) times daily as needed for nausea or vomiting.    [provider]  pregabalin (LYRICA) 50 MG capsule Take 50 mg by mouth 2 (two) times daily. 10/14/20   [provider]  risperiDONE (RISPERDAL) 1 MG tablet Take 1 mg by mouth in the morning. 07/17/20   [provider]  topiramate (TOPAMAX) 100 MG tablet Take 100 mg by mouth 2 (two) times daily. 10/14/20   [provider]      Allergies  Venlafaxine    Review of Systems   Review of Systems  Musculoskeletal:  Positive for arthralgias.  Skin:  Positive for wound.  All other systems reviewed and are negative.   Physical Exam Updated Vital Signs BP (!) 169/89 (BP Location: Left Arm)   Pulse 71   Temp 98.6 F (37 C) (Oral)   Resp 16   Ht 5\' 10"  (1.778 m)   Wt 78.5 kg   SpO2 96%   BMI 24.83 kg/m  Physical Exam Vitals and nursing note reviewed.  Constitutional:      General: He is not in acute distress.    Appearance: Normal appearance. He is normal weight. He is not ill-appearing.  HENT:     Head: Normocephalic and atraumatic.  Cardiovascular:     Pulses:          Dorsalis pedis pulses are 2+ on the right side and 2+  on the left side.       Posterior tibial pulses are 2+ on the left side.  Pulmonary:     Effort: Pulmonary effort is normal. No respiratory distress.  Abdominal:     General: Abdomen is flat.  Musculoskeletal:        General: Normal range of motion.     Cervical back: Neck supple.     Comments: Full active range of motion of the left foot, ankle and toes.  Capillary refill less than 2 seconds.  DP and PT pulses 2+ bilaterally.  Sensation intact in all toes.  Skin:    General: Skin is warm and dry.     Capillary Refill: Capillary refill takes less than 2 seconds.     Comments: Small 0.5 cm laceration versus skin tear of the pad of the left great toe.  No surrounding erythema, induration or fluctuance.  No drainage appreciated.  No tracking erythema.  Neurological:     Mental Status: He is alert and oriented to person, place, and time.  Psychiatric:        Mood and Affect: Mood normal.        Behavior: Behavior normal.     ED Results / Procedures / Treatments   Labs (all labs ordered are listed, but only abnormal results are displayed) Labs Reviewed - No data to display  EKG None  Radiology DG Foot Complete Left  Result Date: 09/05/2022 CLINICAL DATA:  Left great toe injury. Fell 2-3 weeks ago hitting left great toe. Wound on plantar aspect of great toe EXAM: LEFT FOOT - COMPLETE 3+ VIEW COMPARISON:  None Available. FINDINGS: Mild lateral great toe metatarsophalangeal joint space narrowing and peripheral osteophytosis. Moderate dorsal talonavicular degenerative osteophytosis, greatest on the medial navicular side. No acute fracture or dislocation. IMPRESSION: 1. No acute fracture. 2. 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 Electronically Signed   By: Neita Garnet M.D.   On: 09/05/2022 17:09    Procedures Procedures    Medications Ordered in ED Medications  ketorolac (TORADOL) injection 30 mg (30 mg Intramuscular Given 09/05/22 1836)   ciprofloxacin (CIPRO) tablet 500 mg (500 mg Oral Given 09/05/22 1834)    ED Course/ Medical Decision Making/ A&P                             Medical Decision Making Amount and/or Complexity of Data Reviewed Radiology: ordered.  Risk Prescription drug management.  This patient presents to the ED for concern of left great toe pain, plantar surface  laceration, this involves an extensive number of treatment options, and is a complaint that carries with it a high risk of complications and morbidity.  The differential diagnosis includes fracture, strain, sprain, cellulitis, osteomyelitis  Co morbidities that complicate the patient evaluation   hypertension  My initial workup includes x-ray, pain control, ciprofloxacin  Additional history obtained from: Nursing notes from this visit. Family wife is present and provides a portion of the history  I ordered imaging studies including x-ray left foot I independently visualized and interpreted imaging which showed left great toe arthritis, otherwise normal I agree with the radiologist interpretation  Afebrile, significantly hypertensive which improved after pain control but otherwise hemodynamically stable.  52 year old male presenting to the ED for evaluation of left great toe pain.  States he cut the plantar surface of his toe 3 days ago.  Has had pain since that time.  He has been keeping the wound clean.  There are no signs of infection.  His x-ray did show arthritis of the great toe but is otherwise reassuring.  No evidence of fracture, dislocation, osteomyelitis.  He is not diabetic or otherwise immunocompromised.  The wound does not appear infected at this time.  He was treated with Toradol in the ED and reports significant improvement.  Given laceration on dorsum of the foot, he will be treated with ciprofloxacin and encouraged to follow-up with his primary care provider.  He has an appointment in 7 days.  He was encouraged to use Tylenol  and his home Dilaudid for pain as needed.  He was given a postop shoe.  He was given return precautions.  Stable at discharge.  At this time there does not appear to be any evidence of an acute emergency medical condition and the patient appears stable for discharge with appropriate outpatient follow up. Diagnosis was discussed with patient who verbalizes understanding of care plan and is agreeable to discharge. I have discussed return precautions with patient and wife who verbalizes understanding. Patient encouraged to follow-up with their PCP within 1 week. All questions answered.  Note: Portions of this report may have been transcribed using voice recognition software. Every effort was made to ensure accuracy; however, inadvertent computerized transcription errors may still be present.        Final Clinical Impression(s) / ED Diagnoses Final diagnoses:  Pain of toe of left foot    Rx / DC Orders ED Discharge Orders          Ordered    ciprofloxacin (CIPRO) 500 MG tablet  Every 12 hours        09/05/22 1916              Mora Bellman 09/05/22 1921    Melene Plan, DO 09/05/22 1933

## 2022-09-05 NOTE — Discharge Instructions (Addendum)
You have been seen today for your complaint of left great toe pain. Your imaging showed arthritis of the toe but was otherwise reassuring. Your discharge medications include ciprofloxacin. This is an antibiotic. You should take it as prescribed. You should take it for the entire duration of the prescription. This may cause an upset stomach. This is normal. You may take this with food. You may also eat yogurt to prevent diarrhea.  Alternate tylenol and ibuprofen for pain. You may alternate these every 4 hours. You may take up to 800 mg of ibuprofen at a time and up to 1000 mg of tylenol. Home care instructions are as follows:  Keep the foot clean and dry. Soak in warm soapy water once daily. Follow up with: your primary care provider as scheduled Please seek immediate medical care if you develop any of the following symptoms: Your foot or toes turn white or blue. You have warmth and redness along your foot. At this time there does not appear to be the presence of an emergent medical condition, however there is always the potential for conditions to change. Please read and follow the below instructions.  Do not take your medicine if  develop an itchy rash, swelling in your mouth or lips, or difficulty breathing; call 911 and seek immediate emergency medical attention if this occurs.  You may review your lab tests and imaging results in their entirety on your MyChart account.  Please discuss all results of fully with your primary care provider and other specialist at your follow-up visit.  Note: Portions of this text may have been transcribed using voice recognition software. Every effort was made to ensure accuracy; however, inadvertent computerized transcription errors may still be present.

## 2022-09-05 NOTE — ED Notes (Signed)
Pt is A&O x4. Pt has wound on the bottom of his L big toe. No exudate noted. Pt's toe is slightly swollen. Ambulates with a limp, utilizing his cane. Denies HA, CP, or N/V/D.

## 2022-09-05 NOTE — ED Notes (Signed)
Cleaned the bottom of the pt's L toe with alcohol. Applied 2x2 and secured with tape. Pt tolerated the wound care with no distress.

## 2022-09-14 DIAGNOSIS — F17200 Nicotine dependence, unspecified, uncomplicated: Secondary | ICD-10-CM | POA: Diagnosis not present

## 2022-09-14 DIAGNOSIS — M545 Low back pain, unspecified: Secondary | ICD-10-CM | POA: Diagnosis not present

## 2022-09-14 DIAGNOSIS — G2581 Restless legs syndrome: Secondary | ICD-10-CM | POA: Diagnosis not present

## 2022-09-14 DIAGNOSIS — F331 Major depressive disorder, recurrent, moderate: Secondary | ICD-10-CM | POA: Diagnosis not present

## 2022-09-14 DIAGNOSIS — Z79899 Other long term (current) drug therapy: Secondary | ICD-10-CM | POA: Diagnosis not present

## 2022-09-14 DIAGNOSIS — R55 Syncope and collapse: Secondary | ICD-10-CM | POA: Diagnosis not present

## 2022-09-14 DIAGNOSIS — F419 Anxiety disorder, unspecified: Secondary | ICD-10-CM | POA: Diagnosis not present

## 2022-09-14 DIAGNOSIS — R030 Elevated blood-pressure reading, without diagnosis of hypertension: Secondary | ICD-10-CM | POA: Diagnosis not present

## 2022-09-20 DIAGNOSIS — Z79899 Other long term (current) drug therapy: Secondary | ICD-10-CM | POA: Diagnosis not present

## 2022-10-10 ENCOUNTER — Other Ambulatory Visit: Payer: Self-pay

## 2022-10-10 ENCOUNTER — Emergency Department (HOSPITAL_COMMUNITY)
Admission: EM | Admit: 2022-10-10 | Discharge: 2022-10-10 | Disposition: A | Discharge status: Home / Self Care | Payer: Medicare HMO | Attending: Emergency Medicine | Admitting: Emergency Medicine

## 2022-10-10 ENCOUNTER — Emergency Department (HOSPITAL_COMMUNITY): Payer: Medicare HMO

## 2022-10-10 DIAGNOSIS — R0789 Other chest pain: Secondary | ICD-10-CM | POA: Insufficient documentation

## 2022-10-10 DIAGNOSIS — I10 Essential (primary) hypertension: Secondary | ICD-10-CM | POA: Insufficient documentation

## 2022-10-10 DIAGNOSIS — M545 Low back pain, unspecified: Secondary | ICD-10-CM | POA: Diagnosis not present

## 2022-10-10 DIAGNOSIS — Z79899 Other long term (current) drug therapy: Secondary | ICD-10-CM | POA: Diagnosis not present

## 2022-10-10 LAB — CBC
HCT: 44.7 % (ref 39.0–52.0)
Hemoglobin: 14.6 g/dL (ref 13.0–17.0)
MCH: 30.5 pg (ref 26.0–34.0)
MCHC: 32.7 g/dL (ref 30.0–36.0)
MCV: 93.3 fL (ref 80.0–100.0)
Platelets: 238 10*3/uL (ref 150–400)
RBC: 4.79 MIL/uL (ref 4.22–5.81)
RDW: 13.6 % (ref 11.5–15.5)
WBC: 8 10*3/uL (ref 4.0–10.5)
nRBC: 0 % (ref 0.0–0.2)

## 2022-10-10 LAB — BASIC METABOLIC PANEL
Anion gap: 10 (ref 5–15)
BUN: 17 mg/dL (ref 6–20)
CO2: 23 mmol/L (ref 22–32)
Calcium: 9.1 mg/dL (ref 8.9–10.3)
Chloride: 104 mmol/L (ref 98–111)
Creatinine, Ser: 1.11 mg/dL (ref 0.61–1.24)
GFR, Estimated: >60 mL/min (ref >60)
Glucose, Bld: 125 mg/dL — ABNORMAL HIGH (ref 70–99)
Potassium: 3.5 mmol/L (ref 3.5–5.1)
Sodium: 137 mmol/L (ref 135–145)

## 2022-10-10 LAB — TROPONIN I (HIGH SENSITIVITY): Troponin I (High Sensitivity): 3 ng/L (ref <18)

## 2022-10-10 MED ORDER — KETOROLAC TROMETHAMINE 60 MG/2ML IM SOLN
30.0000 mg | Freq: Once | INTRAMUSCULAR | Status: AC
  Administered 2022-10-10: 30 mg via INTRAMUSCULAR
  Filled 2022-10-10: qty 2

## 2022-10-10 MED ORDER — NAPROXEN 375 MG PO TABS: 375.0000 mg | ORAL_TABLET | Freq: Two times a day (BID) | ORAL | 0 refills | Status: AC
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
## 2022-10-10 NOTE — ED Triage Notes (Signed)
Pt with left sided chest pain.  Pt states he was [place] in the back" [place] memorial [place] and has had pain that comes from his back to his chest since then.  Worse with breathing.  9/10.  Hurts continuously.  No n/v/d or shob.
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
## 2022-10-16 ENCOUNTER — Telehealth: Payer: Self-pay

## 2022-10-16 NOTE — Telephone Encounter (Signed)
Transition Care Management Follow-up Telephone Call Date of discharge and from where: 10/10/2022 [facility] How have you been since you were released from the hospital? Patient is feeling better Any questions or concerns? No  Items Reviewed: Did the pt receive and understand the discharge instructions provided? Yes  Medications obtained and verified? Yes  Other? No  Any new allergies since your discharge? No  Dietary orders reviewed? Yes Do you have support at home? Yes   Follow up appointments reviewed:  PCP Hospital f/u appt confirmed?  Patient has moved to [place]  Scheduled to see  on  @ . Specialist Hospital f/u appt confirmed? No  Scheduled to see  on  @ . Are transportation arrangements needed? No  If their condition worsens, is the pt aware to call PCP or go to the Emergency Dept.? Yes Was the patient provided with contact information for the PCP's office or ED? Yes Was to pt encouraged to call back with questions or concerns? Yes  Jon Long [facility]  [facility] Population Health Community Resource Care Guide   ??[email or web address]  ?? [phone]   Website: [email or web address]  [email or web address]
